# Patient Record
Sex: Male | Born: 1960 | Race: White | Hispanic: No | Marital: Married | Smoking: Never smoker
Health system: Southern US, Community
[De-identification: ages and names within clinical notes are randomized; demographics above are authoritative.]

---

## 2014-12-16 ENCOUNTER — Ambulatory Visit (INDEPENDENT_AMBULATORY_CARE_PROVIDER_SITE_OTHER): Payer: 59 | Admitting: Emergency Medicine

## 2014-12-16 ENCOUNTER — Ambulatory Visit (INDEPENDENT_AMBULATORY_CARE_PROVIDER_SITE_OTHER): Payer: 59

## 2014-12-16 VITALS — BP 122/72 | HR 100 | Temp 98.7°F | Resp 17 | Ht 67.0 in | Wt 171.0 lb

## 2014-12-16 DIAGNOSIS — J029 Acute pharyngitis, unspecified: Secondary | ICD-10-CM | POA: Diagnosis not present

## 2014-12-16 DIAGNOSIS — R05 Cough: Secondary | ICD-10-CM | POA: Diagnosis not present

## 2014-12-16 DIAGNOSIS — R059 Cough, unspecified: Secondary | ICD-10-CM

## 2014-12-16 DIAGNOSIS — R509 Fever, unspecified: Secondary | ICD-10-CM

## 2014-12-16 DIAGNOSIS — E109 Type 1 diabetes mellitus without complications: Secondary | ICD-10-CM

## 2014-12-16 LAB — POCT CBC
Granulocyte percent: 80 %G (ref 37–80)
HCT, POC: 43 % — AB (ref 43.5–53.7)
HEMOGLOBIN: 14.3 g/dL (ref 14.1–18.1)
Lymph, poc: 1.6 (ref 0.6–3.4)
MCH, POC: 27.3 pg (ref 27–31.2)
MCHC: 33.3 g/dL (ref 31.8–35.4)
MCV: 82 fL (ref 80–97)
MID (cbc): 0.4 (ref 0–0.9)
MPV: 6.7 fL (ref 0–99.8)
POC Granulocyte: 8.3 — AB (ref 2–6.9)
POC LYMPH PERCENT: 15.7 %L (ref 10–50)
POC MID %: 4.3 % (ref 0–12)
Platelet Count, POC: 269 10*3/uL (ref 142–424)
RBC: 5.24 M/uL (ref 4.69–6.13)
RDW, POC: 14.3 %
WBC: 10.4 10*3/uL — AB (ref 4.6–10.2)

## 2014-12-16 LAB — POCT INFLUENZA A/B
INFLUENZA A, POC: NEGATIVE
Influenza B, POC: NEGATIVE

## 2014-12-16 LAB — GLUCOSE, POCT (MANUAL RESULT ENTRY): POC GLUCOSE: 231 mg/dL — AB (ref 70–99)

## 2014-12-16 LAB — POCT RAPID STREP A (OFFICE): Rapid Strep A Screen: NEGATIVE

## 2014-12-16 MED ORDER — FLUTICASONE PROPIONATE 50 MCG/ACT NA SUSP: 2.0000 | Freq: Every day | NASAL | Status: AC

## 2014-12-16 MED ORDER — AMOXICILLIN 875 MG PO TABS: 875.0000 mg | ORAL_TABLET | Freq: Two times a day (BID) | ORAL | Status: AC

## 2014-12-16 NOTE — Progress Notes (Addendum)
Subjective:  This chart was scribed for Gregory Chris, MD by Broadus John, Medical Scribe. This patient was seen in Room 5 and the patient's care was started at 11:50 AM.   Patient ID: Gregory Blair, male    DOB: 05-Mar-1961, 54 y.o.   MRN: [id number]  Chief Complaint  Patient presents with  . Cough  . URI    HPI HPI Comments: Gregory Blair is a 54 y.o. male who presents to [facility] complaining of symptoms of URI, gradual onset 2 days ago.  Pt notes that he has associated symptoms of productive cough of golden color, sore throat, congestion, sinus pressure, light fever, and back ache. He indicates that the symptoms are mostly present in his sinus area but not in the chest area. Pt reports that initially his sister was presented with the same symptoms while they were on a trip to [place] for a wedding celebration, and then his wife was seen here yesterday for similar symptoms and was diagnosed with an ear infection, Pt notes that he took Tylenol to help with the fever today. He states that today the symptoms are less severe than yesterday.   Pt is not a smoker. He notes that he has his diabetes under control.     There are no active problems to display for this patient.  No past medical history on file. No past surgical history on file. No Known Allergies Prior to Admission medications   Medication Sig Start Date End Date Taking? Authorizing Provider  atorvastatin (LIPITOR) 10 MG tablet Take 10 mg by mouth daily.   Yes Historical Provider, MD  insulin detemir (LEVEMIR) 100 UNIT/ML injection Inject into the skin at bedtime.   Yes Historical Provider, MD  insulin lispro (HUMALOG) 100 UNIT/ML injection Inject into the skin 3 (three) times daily before meals.   Yes Historical Provider, MD  metFORMIN (GLUCOPHAGE) 500 MG tablet Take by mouth 2 (two) times daily with a meal.   Yes Historical Provider, MD   History   Social History  . Marital Status: Married      Spouse Name: N/A  . Number of Children: N/A  . Years of Education: N/A   Occupational History  . Not on file.   Social History Main Topics  . Smoking status: Never Smoker   . Smokeless tobacco: Not on file  . Alcohol Use: Not on file  . Drug Use: Not on file  . Sexual Activity: Not on file   Other Topics Concern  . Not on file   Social History Narrative  . No narrative on file    Review of Systems  Constitutional: Positive for fever.  HENT: Positive for congestion, sinus pressure and sore throat.   Respiratory: Positive for cough.   Musculoskeletal: Positive for back pain.      Objective:   Physical Exam  Constitutional: He is oriented to person, place, and time. He appears well-developed and well-nourished. No distress.  HENT:  Head: Normocephalic and atraumatic.  Significant nasal congestion  Eyes: EOM are normal. Pupils are equal, round, and reactive to light.  Neck: Neck supple.  Cardiovascular: Normal rate.   Pulmonary/Chest: Effort normal and breath sounds normal. No respiratory distress. He has no wheezes. He has no rales.  Neurological: He is alert and oriented to person, place, and time. No cranial nerve deficit.  Skin: Skin is warm and dry.  Psychiatric: He has a normal mood and affect. His behavior is normal.  Nursing note  and vitals reviewed.  BP 122/72 mmHg  Pulse 100  Temp(Src) 98.7 F (37.1 C) (Oral)  Resp 17  Ht  (1.702 m)  Wt 171 lb (77.565 kg)  BMI 26.78 kg/m2  SpO2 98% Results for orders placed or performed in visit on 12/16/14  POCT CBC  Result Value Ref Range   WBC 10.4 (A) 4.6 - 10.2 K/uL   Lymph, poc 1.6 0.6 - 3.4   POC LYMPH PERCENT 15.7 10 - 50 %L   MID (cbc) 0.4 0 - 0.9   POC MID % 4.3 0 - 12 %M   POC Granulocyte 8.3 (A) 2 - 6.9   Granulocyte percent 80.0 37 - 80 %G   RBC 5.24 4.69 - 6.13 M/uL   Hemoglobin 14.3 14.1 - 18.1 g/dL   HCT, POC 91.4 (A) 78.2 - 53.7 %   MCV 82.0 80 - 97 fL   MCH, POC 27.3 27 - 31.2 pg    MCHC 33.3 31.8 - 35.4 g/dL   RDW, POC 95.6 %   Platelet Count, POC 269 142 - 424 K/uL   MPV 6.7 0 - 99.8 fL  POCT rapid strep A  Result Value Ref Range   Rapid Strep A Screen Negative Negative  POCT glucose (manual entry)  Result Value Ref Range   POC Glucose 231 (A) 70 - 99 mg/dl  POCT Influenza A/B  Result Value Ref Range   Influenza A, POC Negative Negative   Influenza B, POC Negative Negative  [facility] reading (PRIMARY) by  Dr. Cleta Alberts increased markings right middle lobe and retrocardiac on the left.      Assessment & Plan:  I placed patient on amoxicillin twice a day. His white cell count is slightly elevated and he has some early signs of sinusitis. Chest x-ray shows increased markings but no consolidated pneumonia I did not feel comfortable not treating him because of his diabetes.I personally performed the services described in this documentation, which was scribed in my presence. The recorded information has been reviewed and is accurate.  Earl Lites, MD

## 2015-12-18 IMAGING — CR DG CHEST 2V
2 series · 2 of 2 positions shown · non-contrast
Comparison: None.

CLINICAL DATA: Cough and congestion for 2 days

EXAM:
CHEST  2 VIEW

[PA]
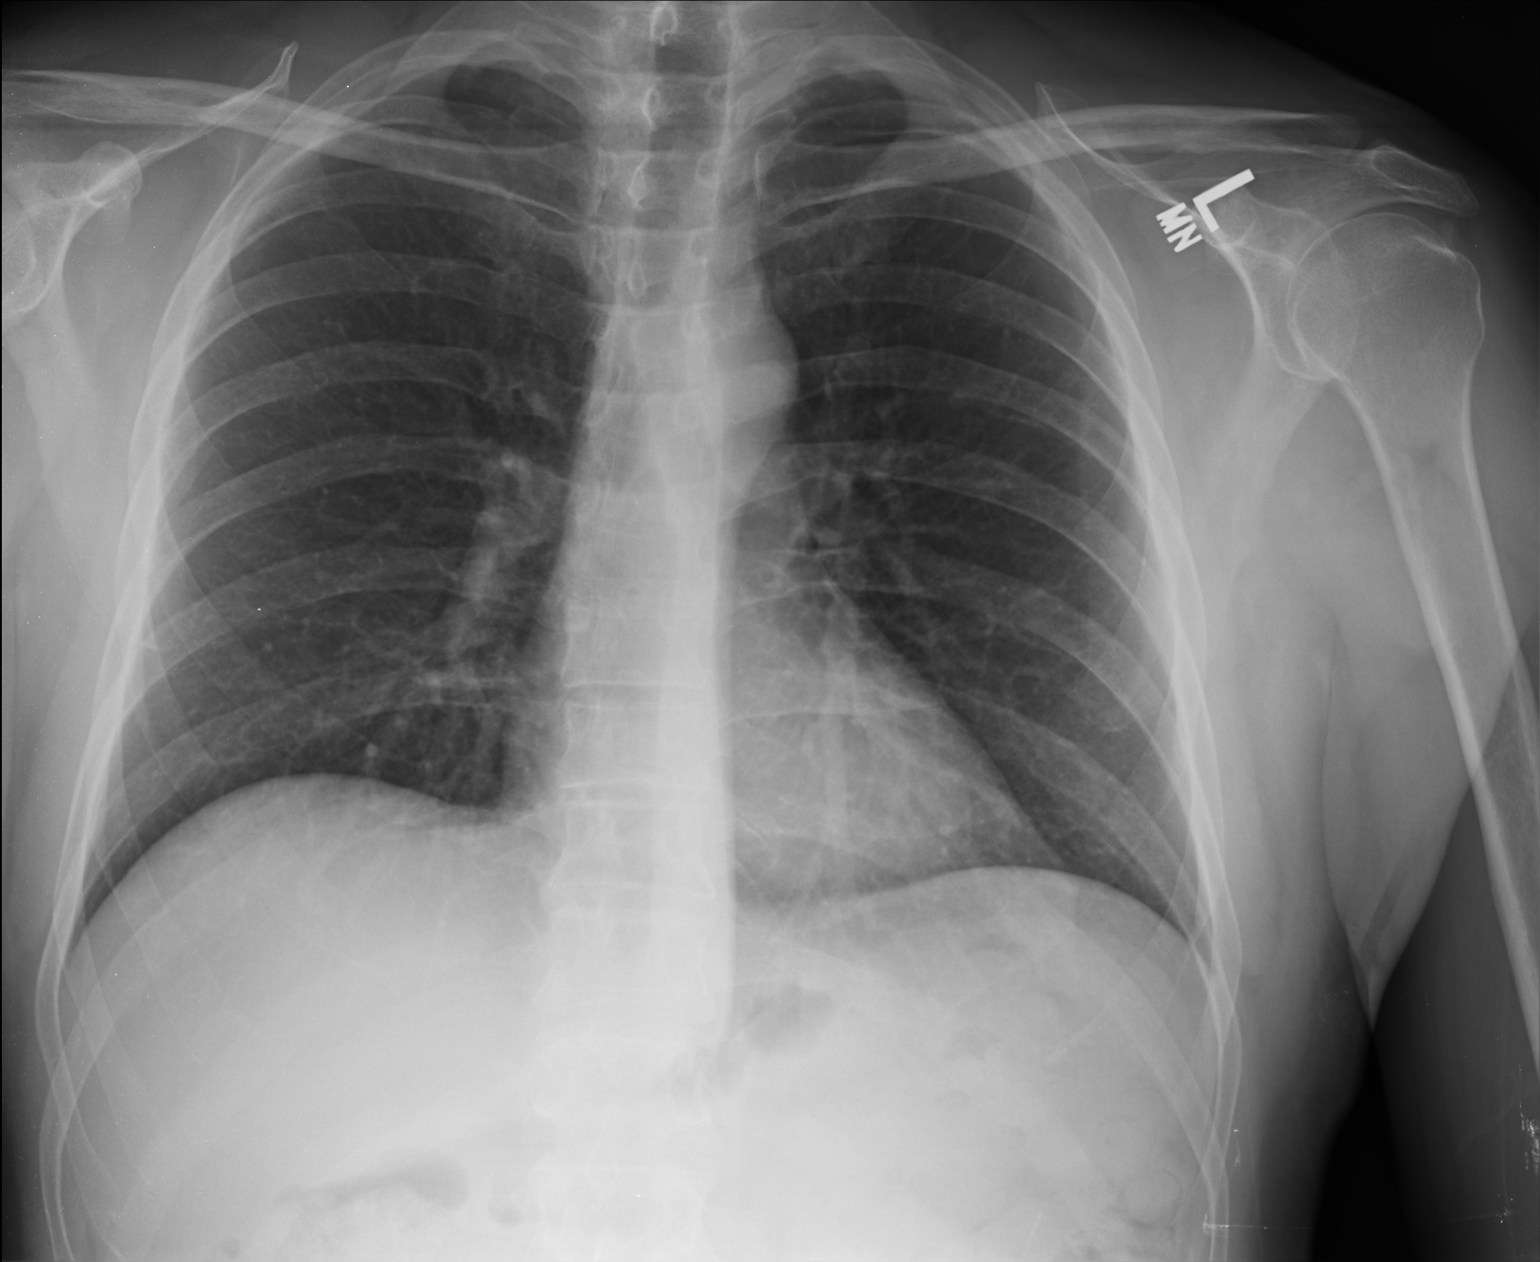

[lateral]
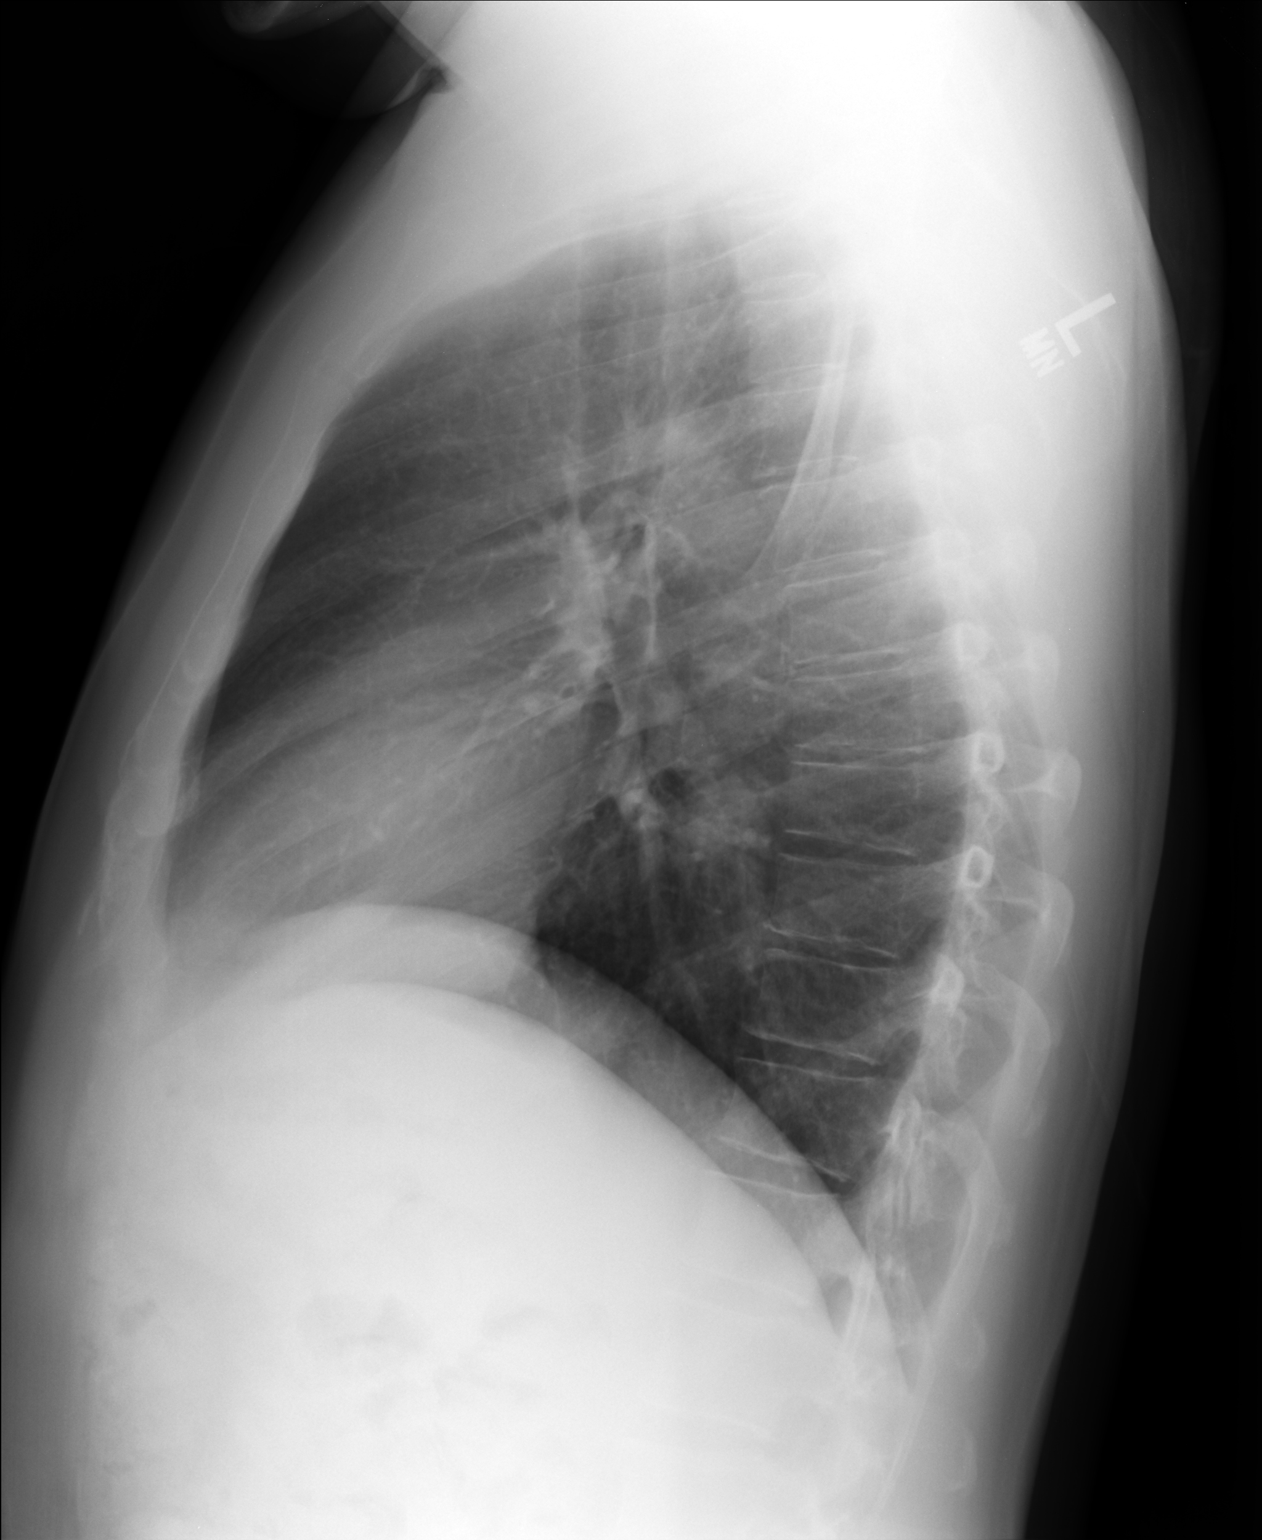

[2 of 2 positions shown; findings below may reference images not displayed]

FINDINGS: The heart size and mediastinal contours are within normal limits.
Both lungs are clear. The visualized skeletal structures are
unremarkable.
IMPRESSION: No active cardiopulmonary disease.
